# Patient Record
Sex: Female | Born: 2000 | Race: White | Hispanic: No | Marital: Single | Smoking: Never smoker
Health system: Southern US, Community
[De-identification: ages and names within clinical notes are randomized; demographics above are authoritative.]

## PROBLEM LIST (undated history)

## (undated) DIAGNOSIS — E1010 Type 1 diabetes mellitus with ketoacidosis without coma: Secondary | ICD-10-CM

## (undated) DIAGNOSIS — R569 Unspecified convulsions: Secondary | ICD-10-CM

## (undated) DIAGNOSIS — L309 Dermatitis, unspecified: Secondary | ICD-10-CM

## (undated) DIAGNOSIS — E049 Nontoxic goiter, unspecified: Secondary | ICD-10-CM

## (undated) DIAGNOSIS — E11649 Type 2 diabetes mellitus with hypoglycemia without coma: Secondary | ICD-10-CM

## (undated) DIAGNOSIS — R6250 Unspecified lack of expected normal physiological development in childhood: Secondary | ICD-10-CM

## (undated) DIAGNOSIS — E109 Type 1 diabetes mellitus without complications: Secondary | ICD-10-CM

## (undated) DIAGNOSIS — E119 Type 2 diabetes mellitus without complications: Secondary | ICD-10-CM

## (undated) DIAGNOSIS — D649 Anemia, unspecified: Secondary | ICD-10-CM

## (undated) DIAGNOSIS — E063 Autoimmune thyroiditis: Secondary | ICD-10-CM

## (undated) DIAGNOSIS — L509 Urticaria, unspecified: Secondary | ICD-10-CM

## (undated) DIAGNOSIS — J45909 Unspecified asthma, uncomplicated: Secondary | ICD-10-CM

## (undated) HISTORY — DX: Nontoxic goiter, unspecified: E04.9

## (undated) HISTORY — DX: Type 2 diabetes mellitus with hypoglycemia without coma: E11.649

## (undated) HISTORY — DX: Urticaria, unspecified: L50.9

## (undated) HISTORY — DX: Type 1 diabetes mellitus without complications: E10.9

## (undated) HISTORY — DX: Dermatitis, unspecified: L30.9

## (undated) HISTORY — DX: Unspecified convulsions: R56.9

## (undated) HISTORY — DX: Anemia, unspecified: D64.9

## (undated) HISTORY — DX: Type 2 diabetes mellitus without complications: E11.9

## (undated) HISTORY — DX: Autoimmune thyroiditis: E06.3

## (undated) HISTORY — DX: Type 1 diabetes mellitus with ketoacidosis without coma: E10.10

## (undated) HISTORY — DX: Unspecified lack of expected normal physiological development in childhood: R62.50

## (undated) HISTORY — DX: Unspecified asthma, uncomplicated: J45.909

---
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
## 2022-02-06 ENCOUNTER — Telehealth (INDEPENDENT_AMBULATORY_CARE_PROVIDER_SITE_OTHER): Payer: Self-pay

## 2022-02-06 NOTE — Telephone Encounter (Signed)
Covermymeds stated PA needed for Dexcom G6 Sensors:  Sensors Key [id number]

## 2022-02-09 NOTE — Telephone Encounter (Signed)
Sensors APPROVED through 02/07/2023

## 2022-04-21 ENCOUNTER — Telehealth (INDEPENDENT_AMBULATORY_CARE_PROVIDER_SITE_OTHER): Payer: Self-pay

## 2022-04-21 NOTE — Telephone Encounter (Signed)
  Name of who is calling: [facility]  Caller's Relationship to Patient:  Best contact number: [phone] opt [phone] or fax [phone]  Provider they see:  Reason for call: faxed over request for the most recent clinic notes  11/28, 12/5 and 12/8. Fax them back please     PRESCRIPTION REFILL ONLY  Name of prescription:  Pharmacy:

## 2022-04-22 NOTE — Telephone Encounter (Signed)
She is no longer a patient here.

## 2022-11-19 ENCOUNTER — Encounter (INDEPENDENT_AMBULATORY_CARE_PROVIDER_SITE_OTHER): Payer: Self-pay
